# Patient Record
Sex: Female | Born: 1975 | Race: Asian | Hispanic: No | Marital: Married
Health system: Southern US, Community
[De-identification: ages and names within clinical notes are randomized; demographics above are authoritative.]

---

## 1997-11-27 ENCOUNTER — Other Ambulatory Visit: Admission: RE | Admit: 1997-11-27 | Discharge: 1997-11-27 | Payer: Self-pay | Admitting: Obstetrics and Gynecology

## 1998-01-08 ENCOUNTER — Encounter: Admission: RE | Admit: 1998-01-08 | Discharge: 1998-04-08 | Payer: Self-pay | Admitting: Gynecology

## 1998-04-03 ENCOUNTER — Inpatient Hospital Stay (HOSPITAL_COMMUNITY): Admission: AD | Admit: 1998-04-03 | Discharge: 1998-04-05 | Payer: Self-pay | Admitting: Obstetrics and Gynecology

## 1998-07-31 ENCOUNTER — Other Ambulatory Visit: Admission: RE | Admit: 1998-07-31 | Discharge: 1998-07-31 | Payer: Self-pay | Admitting: Gynecology

## 1998-08-06 ENCOUNTER — Ambulatory Visit (HOSPITAL_COMMUNITY): Admission: RE | Admit: 1998-08-06 | Discharge: 1998-08-06 | Payer: Self-pay | Admitting: Gynecology

## 1998-08-06 ENCOUNTER — Encounter: Payer: Self-pay | Admitting: Gynecology

## 1999-09-30 ENCOUNTER — Encounter: Admission: RE | Admit: 1999-09-30 | Discharge: 1999-12-29 | Payer: Self-pay | Admitting: Gynecology

## 1999-12-04 ENCOUNTER — Encounter: Payer: Self-pay | Admitting: Gynecology

## 1999-12-04 ENCOUNTER — Ambulatory Visit (HOSPITAL_COMMUNITY): Admission: RE | Admit: 1999-12-04 | Discharge: 1999-12-04 | Payer: Self-pay | Admitting: Gynecology

## 1999-12-17 ENCOUNTER — Inpatient Hospital Stay (HOSPITAL_COMMUNITY): Admission: AD | Admit: 1999-12-17 | Discharge: 1999-12-19 | Payer: Self-pay | Admitting: Gynecology

## 2001-10-04 ENCOUNTER — Other Ambulatory Visit: Admission: RE | Admit: 2001-10-04 | Discharge: 2001-10-04 | Payer: Self-pay | Admitting: *Deleted

## 2002-03-26 ENCOUNTER — Inpatient Hospital Stay (HOSPITAL_COMMUNITY): Admission: AD | Admit: 2002-03-26 | Discharge: 2002-03-26 | Payer: Self-pay | Admitting: Obstetrics & Gynecology

## 2002-04-08 ENCOUNTER — Inpatient Hospital Stay (HOSPITAL_COMMUNITY): Admission: AD | Admit: 2002-04-08 | Discharge: 2002-04-10 | Payer: Self-pay | Admitting: Obstetrics and Gynecology

## 2002-05-17 ENCOUNTER — Other Ambulatory Visit: Admission: RE | Admit: 2002-05-17 | Discharge: 2002-05-17 | Payer: Self-pay | Admitting: Obstetrics & Gynecology

## 2003-05-18 ENCOUNTER — Other Ambulatory Visit: Admission: RE | Admit: 2003-05-18 | Discharge: 2003-05-18 | Payer: Self-pay | Admitting: Obstetrics & Gynecology

## 2004-09-12 ENCOUNTER — Ambulatory Visit (HOSPITAL_COMMUNITY): Admission: RE | Admit: 2004-09-12 | Discharge: 2004-09-12 | Payer: Self-pay | Admitting: Obstetrics & Gynecology

## 2006-09-30 ENCOUNTER — Encounter: Admission: RE | Admit: 2006-09-30 | Discharge: 2006-09-30 | Payer: Self-pay | Admitting: Obstetrics & Gynecology

## 2009-10-09 ENCOUNTER — Ambulatory Visit (HOSPITAL_COMMUNITY): Admission: RE | Admit: 2009-10-09 | Discharge: 2009-10-09 | Payer: Self-pay | Admitting: Obstetrics & Gynecology

## 2010-06-06 NOTE — H&P (Signed)
[facility] [place]  Patient:    Brittany Riddle, Brittany Riddle                          MRN: [id number] Adm. Date:  12/17/99 Attending:  Gaetano Hawthorne. Lily Peer, M.D.                         History and Physical  REASON FOR ADMISSION:         Induction of labor.  CHIEF COMPLAINT:              Nonreassuring fetal heart rate tracing.  HISTORY:                      The patient is a 35 year old gravida 2, para 1, currently 38-6/[redacted] weeks gestation who came in for a routine OB visit due to the fact that she also has history of gestational diabetic and was having an NST and it became evident that she had two decelerations, one lasting approximately 4-5 minutes down to 60 beats per minute.  She was placed in the lateral position in the office and returned back to a baseline of about 110. Several minutes later she had another deceleration and then returned back to normal once again.  She was sent to [facility] for induction due to the fact that she is almost 39 weeks into her pregnancy . DD:  12/17/99 TD:  12/17/99 Job: [id number]

## 2010-06-06 NOTE — Op Note (Signed)
NAME:  Brittany Riddle, Brittany Riddle                  ACCOUNT NO.:  [id number]   MEDICAL RECORD NO.:  [id number]          PATIENT TYPE:  AMB   LOCATION:  SDC                           FACILITY:  [facility]   PHYSICIAN:  Genia Del, M.D.DATE OF BIRTH:  03-28-1975   DATE OF PROCEDURE:  09/12/2004  DATE OF DISCHARGE:                                 OPERATIVE REPORT   PREOPERATIVE DIAGNOSIS:  Desire for bilateral tubal sterilization.   POSTOPERATIVE DIAGNOSIS:  Desire for bilateral tubal sterilization.   PROCEDURE:  Open laparoscopy with bilateral tubal sterilization with Filshie  clips.   SURGEON:  Dr. Genia Del   ANESTHESIOLOGIST:  Dr. Pamalee Leyden   DESCRIPTION OF PROCEDURE:  Under general anesthesia with endotracheal  intubation, the patient is in lithotomy position for preoperative  laparoscopy.  She is prepped with Betadine on the abdominal, suprapubic,  vulvar, and vaginal areas and draped as usual.  The vaginal exam reveals a  retroverted uterus, normal volume, no adnexal mass.  The speculum is  inserted in the vagina.  The uterus is cannulated.  We then remove the  speculum.  Abdominally, an infiltration of Marcaine 0.25 plain is done with  5 mL at the infraumbilical area.  We then make an infraumbilical incision  with a scalpel over 1.5 cm.  We open the aponeurosis under direct vision  with Mayo scissors and open bluntly the parietal peritoneum with a finger.  We then put a pursestring stitch of Vicryl 0 at the aponeurosis, insert the  Hasson with the laparoscope at that level and create a pneumoperitoneum with  CO2.  We explore the abdominopelvic cavities.  The abdominal cavity is  normal.  The liver, gallbladder, appendix are seen, and no pathology is  visualized.  In the pelvis, the uterus is normal in size and appearance.  Both tubes are normal in size and appearance as well as both ovaries.  No  lesion is seen in the pelvis.  We use Filshie clips to do the tubal  sterilization,  starting on the right side.  We apply the clip at about 2 cm  from the cornua and assure that the mesosalpinx is reached with the clip.  We proceed the same way on the left side.  Hemostasis is adequate.  We  verify the application with a probe.  Pictures were taken before the clips  were applied and after.  We also use Marcaine 0.25 plain 5 mL to spray both  areas where the clips were applied to produce some anesthesia at those  levels.  We remove all instruments.  The CO2 is evacuated.  The trocar is  removed at the infraumbilical area.  We close the aponeurosis by attaching  the Vicryl 0 at that level.  We then close the skin with a Vicryl 4-0 in a  subcuticular stitch.  Hemostasis is adequate at that level.  We remove the  cannula vaginally.  Because of some vaginal bleeding, the speculum is  reinserted in the vagina, and silver nitrate is used to complete hemostasis  at the level where the tenaculum was applied on  the cervix.  All instruments  are remained vaginally.  The estimated blood loss was minimal.  No  complication occurred, and the patient was transferred to recovery room in  good status.      Genia Del, M.D.  Electronically Signed    ML/MEDQ  D:  09/12/2004  T:  09/12/2004  Job:  [id number]

## 2010-06-06 NOTE — H&P (Signed)
[facility] [place]  Patient:    Brittany Riddle, Brittany Riddle                          MRN: [id number] Adm. Date:  12/17/99 Attending:  Gaetano Hawthorne. Lily Peer, M.D.                         History and Physical  CHIEF COMPLAINT:              Nonreassuring fetal heart rate tracing.  HISTORY OF PRESENT ILLNESS:   The patient is a 35 year old, gravida 2, para 1, with an estimated date of confinement of December 6.  The patient is currently 38-6/[redacted] weeks gestation presented to the office today for a nonstress test and an AFI due to the fact that she has gestational diabetes, diet controlled. The patient otherwise has been asymptomatic.  Her AFI was in the 70 percentile for 38 weeks.  Fetus is in the vertex presentation.  She had an anterior placenta, grade 1.  During the nonstress test, the patient had an episode of fetal heart rate deceleration down to 60 beats per minute which lasted about 3 to 5 minutes with recovery back to baseline of 110 beats per minute with good variability and the four of five minutes later had another deceleration for approximately two minutes and returned back to baseline of 120.  She was sent to [facility] for further monitoring and for induction.  Upon arrival to [facility] she was placed on continuous monitoring and had a reactive fetal heart rate tracing.  She had been monitored a couple of hours and was hungry and had not eaten lunch and was allowed to eat.  The plan is to proceed with induction.  Due to the fact that she has just eaten and to allow adequate gastric emptying time, we will hold off at least four to five hours and then place Cervidil for cervical ripening. Her cervix in the office was closed, 50%, posterior and -1.  Of note, her vital signs in our office was blood pressure 100/64 and there was no protein or urine.  She was otherwise asymptomatic.  PRENATAL COURSE:              Essentially had been remarkable only for  the glucose intolerance which is diet controlled.  Also, at 36 weeks she had a group B strep culture which was positive.  PAST MEDICAL HISTORY:         She had a normal spontaneous vaginal delivery without any complications at [redacted] weeks gestation in March of the year of 2000. Gestational diabetes with this pregnancy and also positive group B strep.  ALLERGIES:                    She denies any allergies.  REVIEW OF SYSTEMS:            See [employer] form.  PHYSICAL EXAMINATION:  VITAL SIGNS:                  Blood pressure 100/64.  HEENT:                        Unremarkable.  NECK:                         Supple.  Trachea midline.  No carotid bruits, no thyromegaly.  LUNGS:                        Clear to auscultation without rhonchi or wheezes.  HEART:                        Regular rate and rhythm, no murmurs or gallops.  BREASTS:                      Breast examination done during the first trimester was reported to be normal.  ABDOMEN:                      Gravid uterus, approximately 37 cm fundal height, vertex presentation by Thayer Ohm maneuver, confirmed by ultrasound today.  Positive fetal heart tones.  PELVIC:                       Her cervix was closed, 50%, posterior, -1 station.  EXTREMITIES:                  DTR 1+, negative clonus.  PRENATAL LABORATORY DATA:     A positive blood type.  Negative antibody screen.  VDRL was nonreactive.  Rubella-immune.  Hepatitis B surface antigen and HIV negative.  Alpha fetoprotein was normal.  Blood sugar was abnormal followed by a abnormal three-hour GTT.  Group B strep culture was positive. Pap smear was negative.  ASSESSMENT:                   A 35 year old, gravida 2, para 1, gestational diabetic, diet controlled, positive group B streptococcus culture, was in the office for routine antenatal testing to her gestational diabetes and had two episodes of fetal deceleration and was sent to [facility] for induction due  to the fact that she is almost [redacted] weeks gestation.  Her amniotic fluid index was normal.  A fetus in the vertex presentation.  Due to the fact that she ate approximately one hour ago, we will wait for four or five hours to elapse and then we will place Cervidil for cervical ripening.  She continues to have a reassure fetal heart rate tracing.  We will proceed with induction after the Cervidil, start with Pitocin induction early in the morning.  If any repetition of the fetal heart rate tracing becomes evident then we will need to proceed then at that point with emergency cesarean section.  All this was discussed with the patient.  All questions were answered and we will follow accordingly.  PLAN:                         As per assessment above. DD:  12/17/99 TD:  12/17/99 Job: [id number]

## 2010-06-06 NOTE — H&P (Signed)
   NAME:  Brittany Riddle, Brittany Riddle                            ACCOUNT NO.:  [id number]   MEDICAL RECORD NO.:  [id number]                   PATIENT TYPE:  INP   LOCATION:  9170                                 FACILITY:  [facility]   PHYSICIAN:  Lenoard Aden, M.D.             DATE OF BIRTH:  May 13, 1975   DATE OF ADMISSION:  04/08/2002  DATE OF DISCHARGE:                                HISTORY & PHYSICAL   CHIEF COMPLAINT:  Labor.   HISTORY OF PRESENT ILLNESS:  The patient is a 35 year old Asian female, G3,  P2, EDD April 27, 2002, at 37 weeks with complaints of worsening lower  abdominal pressure, history of precipitous labor, and contractions.   ALLERGIES:  The patient has no known drug allergies.   MEDICATIONS:  Prenatal vitamins.   PAST MEDICAL HISTORY:  History of SVD x2, last labor two hours.  No medical  or surgical hospitalizations outside of pregnancy.   LABORATORY DATA:  Prenatal labs are not available at this time.   PHYSICAL EXAMINATION:  GENERAL:  She is a well-developed, well-nourished,  Asian female, in a moderate amount of distress.  HEENT:  Normal.  LUNGS:  Clear.  HEART:  Regular rate and rhythm.  ABDOMEN:  Soft, gravid, nontender.  GENITALIA:  Cervix 3 cm, +2 cm thick posterior, vertex, 0 station.  EXTREMITIES:  There are no cords.  NEUROLOGICAL:  Nonfocal.   IMPRESSION:  1. Term intrauterine pregnancy in prodromal labor, GBS positive.  2. History of precipitous labor.   PLAN:  1. Admit.  Prednisone prophylaxis.  2. Pitocin augmentation.  3. Epidural p.r.n.                                               Lenoard Aden, M.D.    RJT/MEDQ  D:  04/08/2002  T:  04/08/2002  Job:  [id number]

## 2010-06-06 NOTE — Discharge Summary (Signed)
[facility] [place]  Patient:    Brittany Riddle, Brittany Riddle                         MRN: [id number] Adm. Date:  [id number] Disc. Date: [id number] Attending:  Tonye Royalty Dictator:   Antony Contras, [facility]                           Discharge Summary  DISCHARGE DIAGNOSES:          Intrauterine pregnancy at 38 weeks, history of gestational diabetes diet controlled, nonreassuring fetal heart rate tracing.  PROCEDURE:                    Induction of labor, normal spontaneous vaginal delivery of viable infant.  HISTORY OF PRESENT ILLNESS:   Patient is a 35 year old gravida 2, para 1-0-0-1 with an LMP of questionable March 22, 1999, [facility] December 27, 1999 per ultrasound.  Prenatal risk factors include history of gestational diabetes with previous pregnancy and current pregnancy, diet controlled.  PRENATAL LABORATORIES:        Blood type A+.  Antibody screen negative.  RPR, HBSAG, HIV nonreactive.  MSAFP normal.  GBS positive.  HOSPITAL COURSE:              Patient was admitted for induction of labor secondary to a nonreassuring fetal heart rate tracing in the office.  On admission cervix was closed, 50%, posterior, -1 station.  On admission Cervidil was initiated followed by high dose Pitocin.  She did progress to complete dilatation and delivered an Apgar 7/9 female infant weighing 6 pounds 3 ounces over an intact perineum.  Her postpartum course was uncomplicated.  She remained afebrile and wished to be discharged on her first postpartum day which she was in satisfactory condition.  LABORATORIES:                 CBC:  Hematocrit 30.4, hemoglobin 10.7, WBC 13.1, platelets 164.  DISPOSITION:                  Follow up in six weeks.  Continue prenatal vitamins and iron.  Monitor blood pressures prior to six week checkup. DD:  01/09/00 TD:  01/09/00 Job: [id number]

## 2010-06-24 ENCOUNTER — Other Ambulatory Visit (HOSPITAL_COMMUNITY): Payer: Self-pay | Admitting: Obstetrics & Gynecology

## 2010-06-24 DIAGNOSIS — Z1231 Encounter for screening mammogram for malignant neoplasm of breast: Secondary | ICD-10-CM

## 2010-10-14 ENCOUNTER — Ambulatory Visit (HOSPITAL_COMMUNITY)
Admission: RE | Admit: 2010-10-14 | Discharge: 2010-10-14 | Disposition: A | Discharge status: Home / Self Care | Payer: BC Managed Care – PPO | Source: Ambulatory Visit | Attending: Obstetrics & Gynecology | Admitting: Obstetrics & Gynecology

## 2010-10-14 DIAGNOSIS — Z1231 Encounter for screening mammogram for malignant neoplasm of breast: Secondary | ICD-10-CM | POA: Insufficient documentation

## 2011-08-07 ENCOUNTER — Other Ambulatory Visit (HOSPITAL_COMMUNITY): Payer: Self-pay | Admitting: Family Medicine

## 2011-09-25 ENCOUNTER — Other Ambulatory Visit (HOSPITAL_COMMUNITY): Payer: Self-pay | Admitting: Obstetrics & Gynecology

## 2011-09-25 DIAGNOSIS — Z1231 Encounter for screening mammogram for malignant neoplasm of breast: Secondary | ICD-10-CM

## 2011-10-15 ENCOUNTER — Other Ambulatory Visit (HOSPITAL_COMMUNITY): Payer: Self-pay | Admitting: Family Medicine

## 2011-10-15 ENCOUNTER — Ambulatory Visit (HOSPITAL_COMMUNITY)
Admission: RE | Admit: 2011-10-15 | Discharge: 2011-10-15 | Disposition: A | Discharge status: Home / Self Care | Payer: BC Managed Care – PPO | Source: Ambulatory Visit | Attending: Obstetrics & Gynecology | Admitting: Obstetrics & Gynecology

## 2011-10-15 DIAGNOSIS — Z1231 Encounter for screening mammogram for malignant neoplasm of breast: Secondary | ICD-10-CM | POA: Insufficient documentation

## 2013-11-17 ENCOUNTER — Other Ambulatory Visit (HOSPITAL_COMMUNITY): Payer: Self-pay | Admitting: Family Medicine

## 2013-11-17 DIAGNOSIS — Z1231 Encounter for screening mammogram for malignant neoplasm of breast: Secondary | ICD-10-CM

## 2014-07-26 ENCOUNTER — Other Ambulatory Visit (HOSPITAL_COMMUNITY): Payer: Self-pay | Admitting: Family Medicine

## 2014-07-26 DIAGNOSIS — Z1231 Encounter for screening mammogram for malignant neoplasm of breast: Secondary | ICD-10-CM

## 2014-10-19 ENCOUNTER — Ambulatory Visit (HOSPITAL_COMMUNITY)
Admission: RE | Admit: 2014-10-19 | Discharge: 2014-10-19 | Disposition: A | Discharge status: Home / Self Care | Payer: BLUE CROSS/BLUE SHIELD | Source: Ambulatory Visit | Attending: Family Medicine | Admitting: Family Medicine

## 2014-10-19 ENCOUNTER — Ambulatory Visit (HOSPITAL_COMMUNITY): Payer: Self-pay

## 2014-10-19 DIAGNOSIS — Z1231 Encounter for screening mammogram for malignant neoplasm of breast: Secondary | ICD-10-CM | POA: Insufficient documentation

## 2014-12-31 DIAGNOSIS — Z0000 Encounter for general adult medical examination without abnormal findings: Secondary | ICD-10-CM | POA: Insufficient documentation

## 2014-12-31 DIAGNOSIS — N390 Urinary tract infection, site not specified: Secondary | ICD-10-CM | POA: Insufficient documentation

## 2015-10-07 ENCOUNTER — Other Ambulatory Visit: Payer: Self-pay | Admitting: Family Medicine

## 2015-10-07 DIAGNOSIS — Z1231 Encounter for screening mammogram for malignant neoplasm of breast: Secondary | ICD-10-CM

## 2015-10-21 ENCOUNTER — Ambulatory Visit
Admission: RE | Admit: 2015-10-21 | Discharge: 2015-10-21 | Disposition: A | Discharge status: Home / Self Care | Payer: BLUE CROSS/BLUE SHIELD | Source: Ambulatory Visit | Attending: Family Medicine | Admitting: Family Medicine

## 2015-10-21 DIAGNOSIS — Z1231 Encounter for screening mammogram for malignant neoplasm of breast: Secondary | ICD-10-CM

## 2015-10-24 ENCOUNTER — Other Ambulatory Visit: Payer: Self-pay | Admitting: Family Medicine

## 2015-10-24 DIAGNOSIS — R928 Other abnormal and inconclusive findings on diagnostic imaging of breast: Secondary | ICD-10-CM

## 2015-11-04 ENCOUNTER — Ambulatory Visit
Admission: RE | Admit: 2015-11-04 | Discharge: 2015-11-04 | Disposition: A | Discharge status: Home / Self Care | Payer: BLUE CROSS/BLUE SHIELD | Source: Ambulatory Visit | Attending: Family Medicine | Admitting: Family Medicine

## 2015-11-04 DIAGNOSIS — R928 Other abnormal and inconclusive findings on diagnostic imaging of breast: Secondary | ICD-10-CM

## 2016-10-19 ENCOUNTER — Other Ambulatory Visit: Payer: Self-pay | Admitting: Family Medicine

## 2016-10-19 DIAGNOSIS — Z1231 Encounter for screening mammogram for malignant neoplasm of breast: Secondary | ICD-10-CM

## 2016-10-27 ENCOUNTER — Ambulatory Visit: Payer: BLUE CROSS/BLUE SHIELD

## 2016-10-27 ENCOUNTER — Ambulatory Visit
Admission: RE | Admit: 2016-10-27 | Discharge: 2016-10-27 | Disposition: A | Discharge status: Home / Self Care | Payer: BLUE CROSS/BLUE SHIELD | Source: Ambulatory Visit | Attending: Family Medicine | Admitting: Family Medicine

## 2016-10-27 DIAGNOSIS — Z1231 Encounter for screening mammogram for malignant neoplasm of breast: Secondary | ICD-10-CM

## 2017-01-07 DIAGNOSIS — E559 Vitamin D deficiency, unspecified: Secondary | ICD-10-CM | POA: Insufficient documentation

## 2017-09-17 ENCOUNTER — Other Ambulatory Visit: Payer: Self-pay | Admitting: Family Medicine

## 2017-09-17 DIAGNOSIS — Z1231 Encounter for screening mammogram for malignant neoplasm of breast: Secondary | ICD-10-CM

## 2017-11-01 ENCOUNTER — Ambulatory Visit
Admission: RE | Admit: 2017-11-01 | Discharge: 2017-11-01 | Disposition: A | Discharge status: Home / Self Care | Payer: BLUE CROSS/BLUE SHIELD | Source: Ambulatory Visit | Attending: Family Medicine | Admitting: Family Medicine

## 2017-11-01 DIAGNOSIS — Z1231 Encounter for screening mammogram for malignant neoplasm of breast: Secondary | ICD-10-CM

## 2018-01-17 DIAGNOSIS — S6980XA Other specified injuries of unspecified wrist, hand and finger(s), initial encounter: Secondary | ICD-10-CM | POA: Insufficient documentation

## 2018-09-08 ENCOUNTER — Other Ambulatory Visit: Payer: Self-pay | Admitting: Family Medicine

## 2018-09-08 DIAGNOSIS — Z1231 Encounter for screening mammogram for malignant neoplasm of breast: Secondary | ICD-10-CM

## 2018-11-03 ENCOUNTER — Other Ambulatory Visit: Payer: Self-pay

## 2018-11-03 ENCOUNTER — Ambulatory Visit
Admission: RE | Admit: 2018-11-03 | Discharge: 2018-11-03 | Disposition: A | Discharge status: Home / Self Care | Payer: BC Managed Care – PPO | Source: Ambulatory Visit | Attending: Family Medicine | Admitting: Family Medicine

## 2018-11-03 DIAGNOSIS — Z1231 Encounter for screening mammogram for malignant neoplasm of breast: Secondary | ICD-10-CM

## 2019-08-14 ENCOUNTER — Other Ambulatory Visit: Payer: Self-pay | Admitting: Family Medicine

## 2019-08-14 DIAGNOSIS — Z1231 Encounter for screening mammogram for malignant neoplasm of breast: Secondary | ICD-10-CM

## 2019-11-06 ENCOUNTER — Ambulatory Visit: Payer: BC Managed Care – PPO

## 2019-12-04 ENCOUNTER — Ambulatory Visit
Admission: RE | Admit: 2019-12-04 | Discharge: 2019-12-04 | Disposition: A | Discharge status: Home / Self Care | Payer: BC Managed Care – PPO | Source: Ambulatory Visit | Attending: Family Medicine | Admitting: Family Medicine

## 2019-12-04 ENCOUNTER — Other Ambulatory Visit: Payer: Self-pay

## 2019-12-04 ENCOUNTER — Ambulatory Visit
Admission: RE | Admit: 2019-12-04 | Discharge: 2019-12-04 | Disposition: A | Discharge status: Home / Self Care | Payer: BC Managed Care – PPO | Source: Ambulatory Visit

## 2019-12-04 DIAGNOSIS — Z1231 Encounter for screening mammogram for malignant neoplasm of breast: Secondary | ICD-10-CM

## 2019-12-25 ENCOUNTER — Other Ambulatory Visit: Payer: Self-pay | Admitting: Family Medicine

## 2019-12-25 DIAGNOSIS — Z1231 Encounter for screening mammogram for malignant neoplasm of breast: Secondary | ICD-10-CM

## 2020-10-17 ENCOUNTER — Other Ambulatory Visit: Payer: Self-pay | Admitting: Family Medicine

## 2020-10-17 DIAGNOSIS — Z1231 Encounter for screening mammogram for malignant neoplasm of breast: Secondary | ICD-10-CM

## 2020-10-22 ENCOUNTER — Ambulatory Visit (INDEPENDENT_AMBULATORY_CARE_PROVIDER_SITE_OTHER): Payer: BC Managed Care – PPO | Admitting: Podiatry

## 2020-10-22 ENCOUNTER — Encounter: Payer: Self-pay | Admitting: Podiatry

## 2020-10-22 ENCOUNTER — Other Ambulatory Visit: Payer: Self-pay

## 2020-10-22 ENCOUNTER — Ambulatory Visit (INDEPENDENT_AMBULATORY_CARE_PROVIDER_SITE_OTHER): Payer: BC Managed Care – PPO

## 2020-10-22 ENCOUNTER — Ambulatory Visit: Payer: BC Managed Care – PPO | Admitting: Podiatry

## 2020-10-22 DIAGNOSIS — M722 Plantar fascial fibromatosis: Secondary | ICD-10-CM

## 2020-10-22 MED ORDER — MELOXICAM 15 MG PO TABS: 15.0000 mg | ORAL_TABLET | Freq: Every day | ORAL | 3 refills | Status: AC

## 2020-10-22 NOTE — Patient Instructions (Signed)

## 2020-10-22 NOTE — Progress Notes (Signed)
  Subjective:  Patient ID: Brittany Riddle, female    DOB: 1975/05/21,  MRN: [id number]  Chief Complaint  Patient presents with   Foot Pain     NP // Left heel pain    45 y.o. female presents with the above complaint. History confirmed with patient.  Been very painful when she stands on it.  Its worst in the morning.  Its been going on for few months.  Her friend recommended she come here to get a corticosteroid injection  Objective:  Physical Exam: warm, good capillary refill, no trophic changes or ulcerative lesions, normal DP and PT pulses, and normal sensory exam. Left Foot: point tenderness over the heel pad and point tenderness of the mid plantar fascia Radiographs: Multiple views x-ray of the left foot: no fracture, dislocation, swelling or degenerative changes noted and plantar calcaneal spur Assessment:   1. Plantar fasciitis of left foot      Plan:  Patient was evaluated and treated and all questions answered.  Discussed the etiology and treatment options for plantar fasciitis including stretching, formal physical therapy, supportive shoegears such as a running shoe or sneaker, pre fabricated orthoses, injection therapy, and oral medications. We also discussed the role of surgical treatment of this for patients who do not improve after exhausting non-surgical treatment options.   -XR reviewed with patient -Educated patient on stretching and icing of the affected limb -Plantar fascial brace dispensed -Injection delivered to the plantar fascia of the left foot. -Rx for meloxicam. Educated on use, risks and benefits of the medication  After sterile prep with povidone-iodine solution and alcohol, the left heel was injected with 0.5cc 2% xylocaine plain, 0.5cc 0.5% marcaine plain, 5mg  triamcinolone acetonide, and 2mg  dexamethasone was injected along the medial plantar fascia at the insertion on the plantar calcaneus. The patient tolerated the procedure well without  complication.  Return in about 1 month (around 11/22/2020).

## 2020-11-25 ENCOUNTER — Ambulatory Visit: Payer: BC Managed Care – PPO | Admitting: Podiatry

## 2020-12-09 ENCOUNTER — Ambulatory Visit: Payer: BC Managed Care – PPO

## 2021-01-06 ENCOUNTER — Ambulatory Visit
Admission: RE | Admit: 2021-01-06 | Discharge: 2021-01-06 | Disposition: A | Discharge status: Home / Self Care | Payer: BC Managed Care – PPO | Source: Ambulatory Visit | Attending: Family Medicine | Admitting: Family Medicine

## 2021-01-06 DIAGNOSIS — Z1231 Encounter for screening mammogram for malignant neoplasm of breast: Secondary | ICD-10-CM

## 2021-08-26 ENCOUNTER — Other Ambulatory Visit: Payer: Self-pay | Admitting: Family Medicine

## 2021-08-26 DIAGNOSIS — Z1231 Encounter for screening mammogram for malignant neoplasm of breast: Secondary | ICD-10-CM

## 2022-01-13 ENCOUNTER — Ambulatory Visit: Payer: BC Managed Care – PPO

## 2022-01-20 ENCOUNTER — Ambulatory Visit
Admission: RE | Admit: 2022-01-20 | Discharge: 2022-01-20 | Disposition: A | Discharge status: Home / Self Care | Payer: BC Managed Care – PPO | Source: Ambulatory Visit | Attending: Family Medicine | Admitting: Family Medicine

## 2022-01-20 DIAGNOSIS — Z1231 Encounter for screening mammogram for malignant neoplasm of breast: Secondary | ICD-10-CM

## 2022-08-24 IMAGING — MG MM DIGITAL SCREENING BILAT W/ TOMO AND CAD
8 series · 9 of 24 positions shown · non-contrast
Comparison: Previous exam(s).

CLINICAL DATA: Screening.

EXAM:
DIGITAL SCREENING BILATERAL MAMMOGRAM WITH TOMOSYNTHESIS AND CAD
TECHNIQUE: Bilateral screening digital craniocaudal and mediolateral oblique
mammograms were obtained. Bilateral screening digital breast
tomosynthesis was performed. The images were evaluated with
computer-aided detection.

[R CC synth-2D]
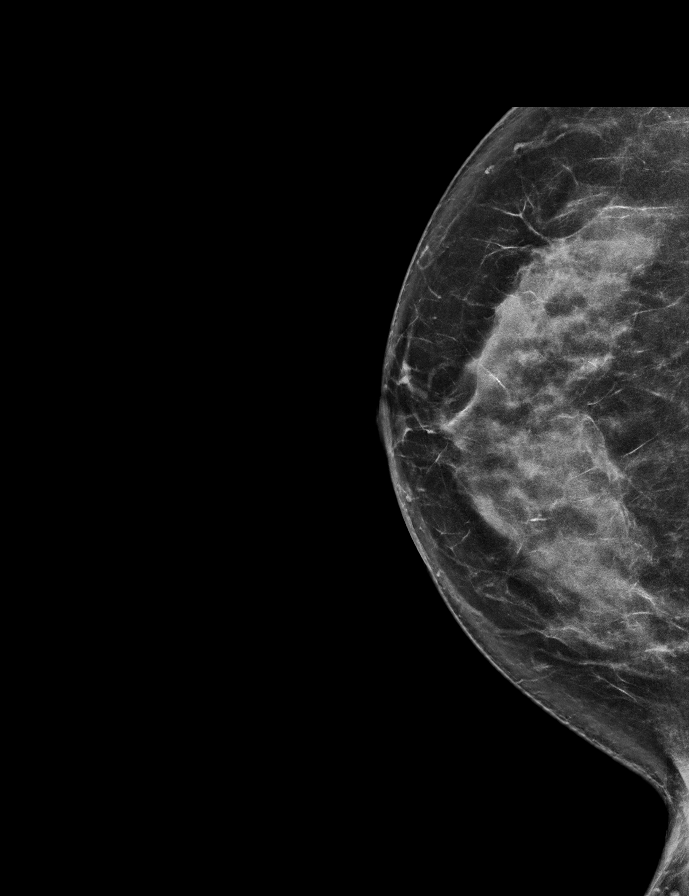

[L MLO synth-2D]
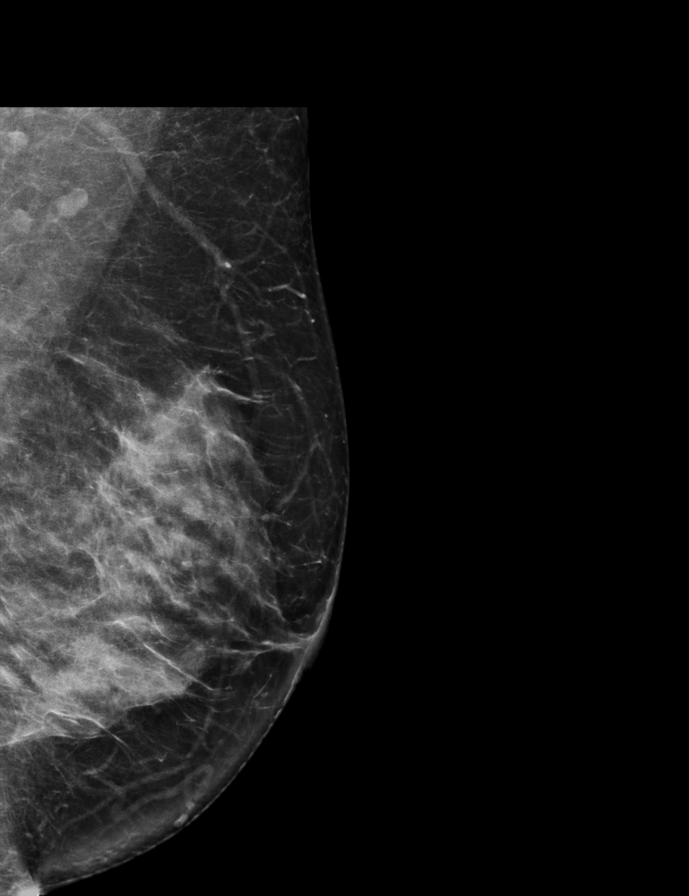

[R MLO synth-2D]
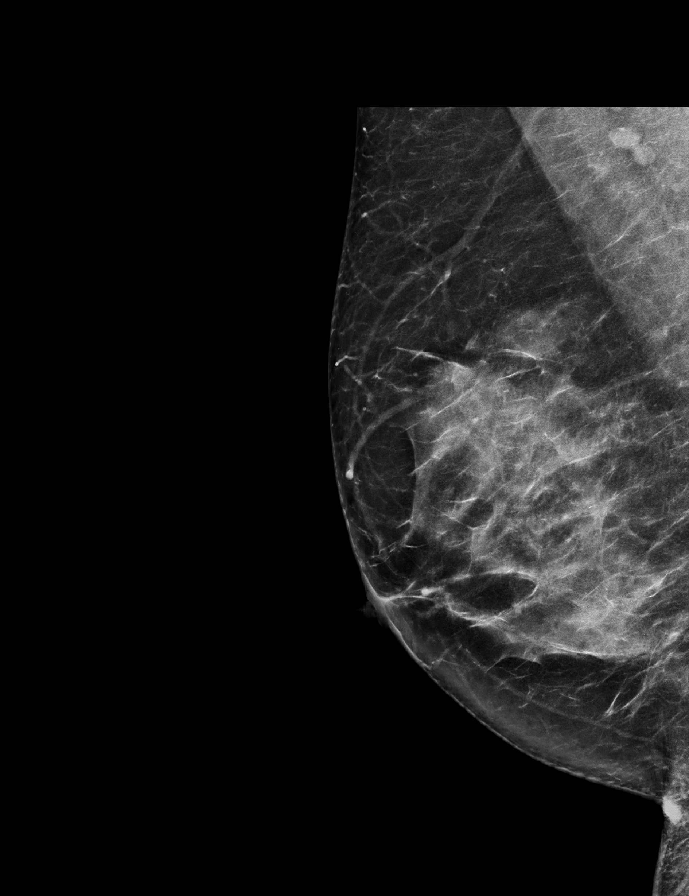

[L CC synth-2D]
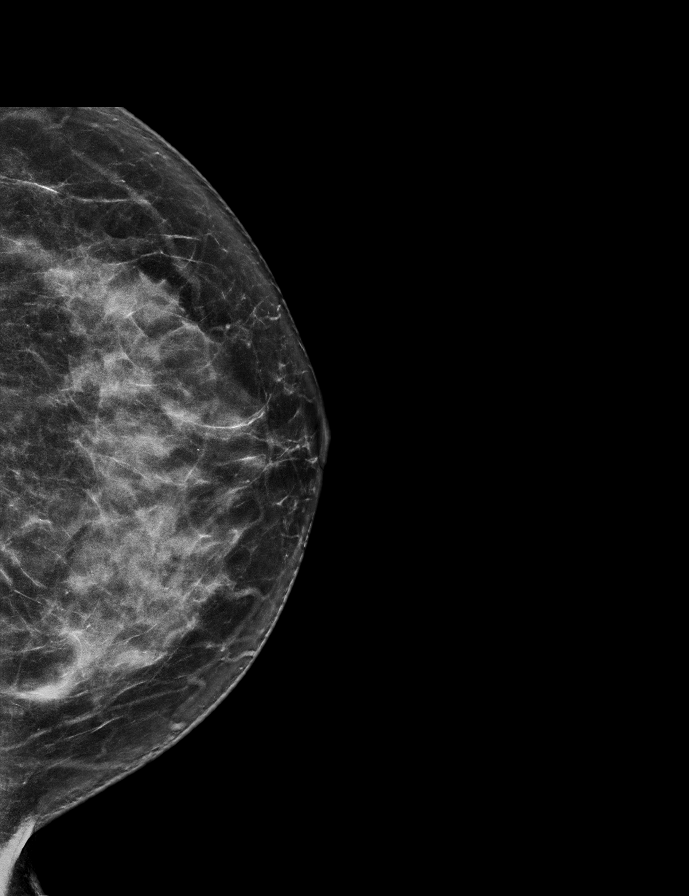

[R CC tomo · 2 of 74 frames shown]
[frame 24/74]
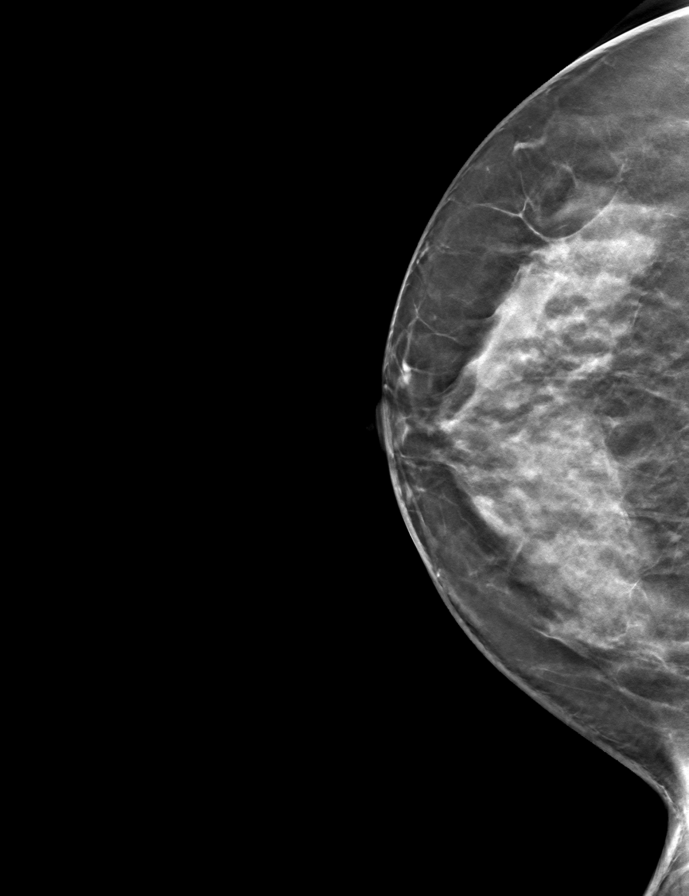
[frame 37/74]
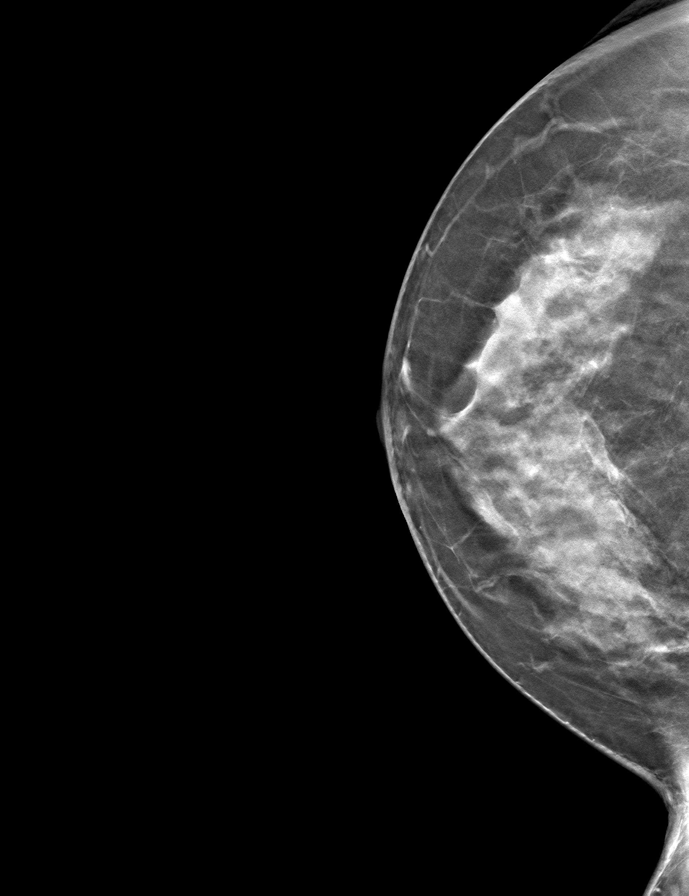

[L CC tomo · tomo slice 36/71.0]
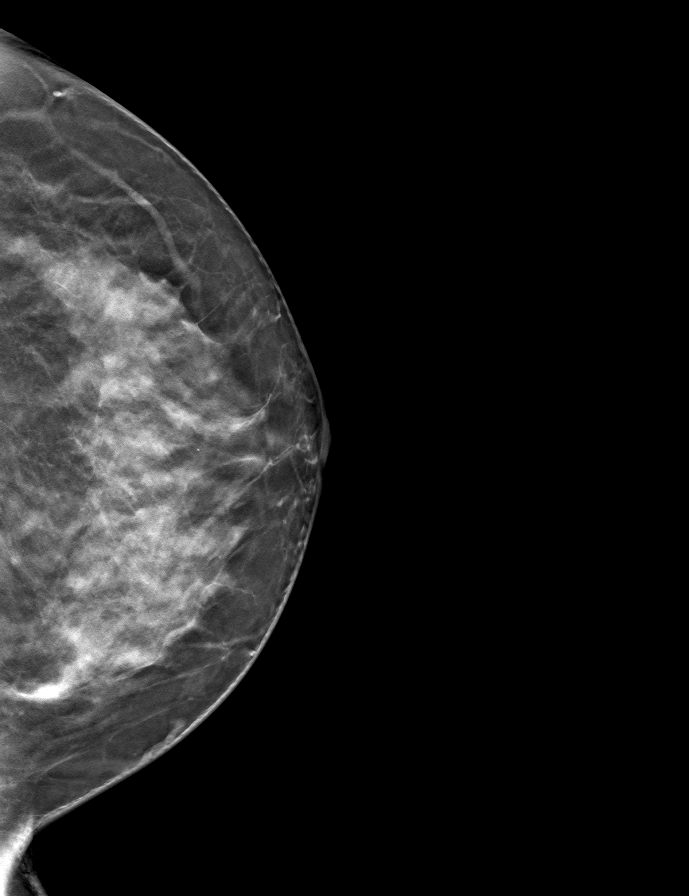

[R MLO tomo · tomo slice 37/73.0]
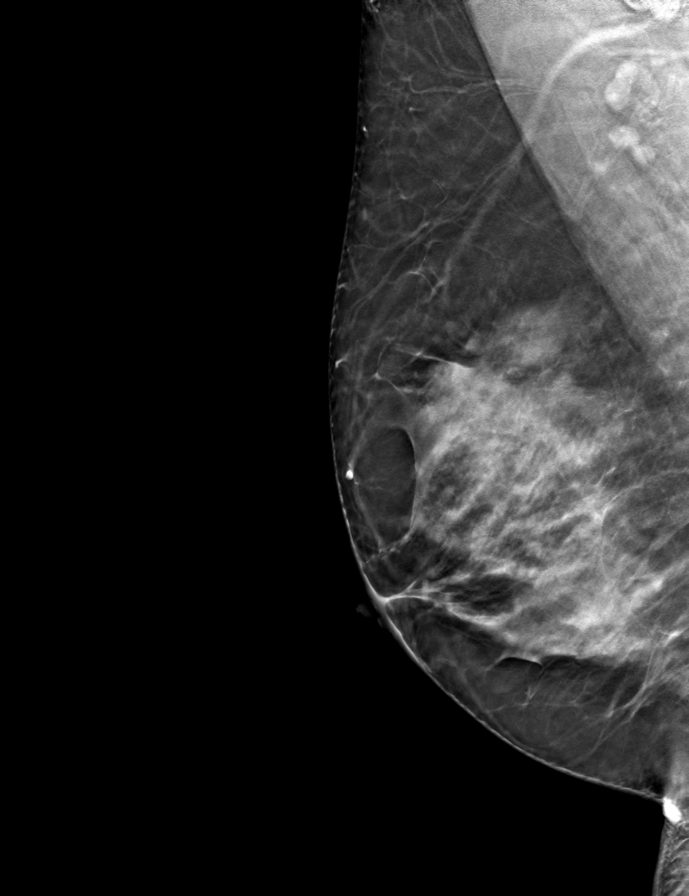

[L MLO tomo · tomo slice 39/77.0]
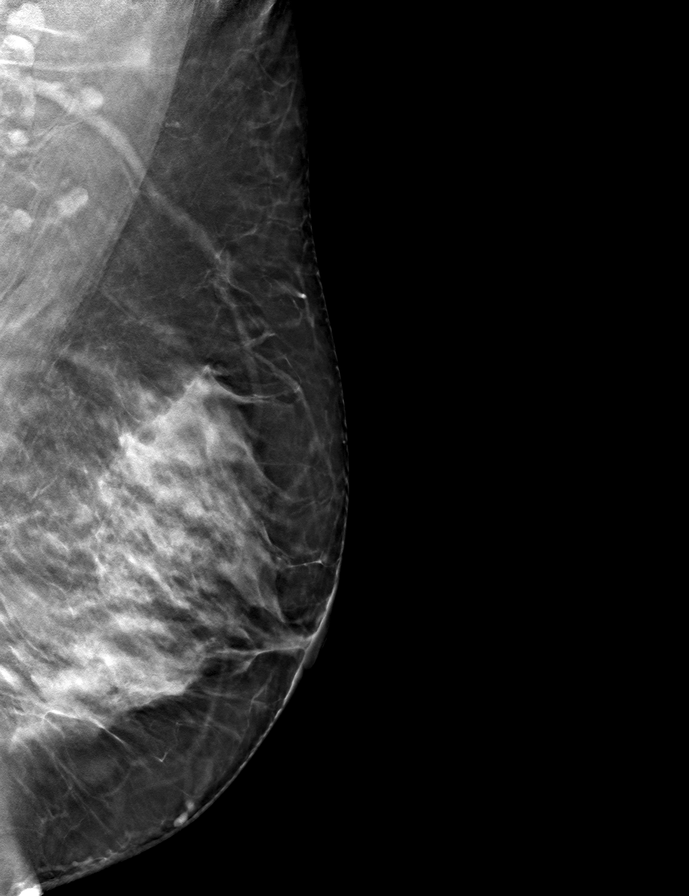

[9 of 24 positions shown; findings below may reference images not displayed]

ACR Breast Density Category c: The breast tissue is heterogeneously
dense, which may obscure small masses.
FINDINGS: There are no findings suspicious for malignancy.
IMPRESSION: No mammographic evidence of malignancy. A result letter of this
screening mammogram will be mailed directly to the patient.

RECOMMENDATION:
Screening mammogram in one year. (Code:Q3-W-BC3)

BI-RADS CATEGORY  1: Negative.

## 2022-12-15 ENCOUNTER — Other Ambulatory Visit: Payer: Self-pay | Admitting: Family Medicine

## 2022-12-15 DIAGNOSIS — Z1231 Encounter for screening mammogram for malignant neoplasm of breast: Secondary | ICD-10-CM

## 2023-01-25 ENCOUNTER — Ambulatory Visit: Payer: BC Managed Care – PPO

## 2023-02-11 ENCOUNTER — Ambulatory Visit
Admission: RE | Admit: 2023-02-11 | Discharge: 2023-02-11 | Disposition: A | Discharge status: Home / Self Care | Payer: BC Managed Care – PPO | Source: Ambulatory Visit | Attending: Family Medicine | Admitting: Family Medicine

## 2023-02-11 DIAGNOSIS — Z1231 Encounter for screening mammogram for malignant neoplasm of breast: Secondary | ICD-10-CM

## 2023-10-08 ENCOUNTER — Other Ambulatory Visit: Payer: Self-pay | Admitting: Family Medicine

## 2023-10-08 DIAGNOSIS — Z1231 Encounter for screening mammogram for malignant neoplasm of breast: Secondary | ICD-10-CM

## 2024-02-14 ENCOUNTER — Ambulatory Visit

## 2024-02-22 ENCOUNTER — Ambulatory Visit

## 2024-02-23 ENCOUNTER — Inpatient Hospital Stay: Admission: RE | Admit: 2024-02-23 | Discharge: 2024-02-23 | Attending: Family Medicine

## 2024-02-23 ENCOUNTER — Ambulatory Visit

## 2024-02-23 DIAGNOSIS — Z1231 Encounter for screening mammogram for malignant neoplasm of breast: Secondary | ICD-10-CM
